# Patient Record
Sex: Female | Born: 1970 | Race: White | Hispanic: No | Marital: Married | Smoking: Current every day smoker
Health system: Southern US, Community
[De-identification: ages and names within clinical notes are randomized; demographics above are authoritative.]

## PROBLEM LIST (undated history)

## (undated) DIAGNOSIS — IMO0002 Reserved for concepts with insufficient information to code with codable children: Secondary | ICD-10-CM

## (undated) DIAGNOSIS — R002 Palpitations: Secondary | ICD-10-CM

## (undated) DIAGNOSIS — N943 Premenstrual tension syndrome: Secondary | ICD-10-CM

## (undated) DIAGNOSIS — I454 Nonspecific intraventricular block: Secondary | ICD-10-CM

## (undated) HISTORY — PX: OTHER SURGICAL HISTORY: SHX169

## (undated) HISTORY — DX: Premenstrual tension syndrome: N94.3

## (undated) HISTORY — DX: Reserved for concepts with insufficient information to code with codable children: IMO0002

## (undated) HISTORY — DX: Nonspecific intraventricular block: I45.4

## (undated) HISTORY — DX: Palpitations: R00.2

---

## 1998-01-22 ENCOUNTER — Encounter: Admission: RE | Admit: 1998-01-22 | Discharge: 1998-01-22 | Payer: Self-pay | Admitting: Family Medicine

## 1998-05-14 ENCOUNTER — Encounter: Admission: RE | Admit: 1998-05-14 | Discharge: 1998-05-14 | Payer: Self-pay | Admitting: Family Medicine

## 1998-05-27 ENCOUNTER — Encounter: Admission: RE | Admit: 1998-05-27 | Discharge: 1998-05-27 | Payer: Self-pay | Admitting: Sports Medicine

## 1998-05-28 ENCOUNTER — Encounter: Admission: RE | Admit: 1998-05-28 | Discharge: 1998-05-28 | Payer: Self-pay | Admitting: Family Medicine

## 1998-07-29 ENCOUNTER — Encounter: Admission: RE | Admit: 1998-07-29 | Discharge: 1998-07-29 | Payer: Self-pay | Admitting: Sports Medicine

## 1998-08-22 ENCOUNTER — Ambulatory Visit (HOSPITAL_COMMUNITY): Admission: RE | Admit: 1998-08-22 | Discharge: 1998-08-22 | Payer: Self-pay | Admitting: Obstetrics

## 1998-08-29 ENCOUNTER — Encounter: Admission: RE | Admit: 1998-08-29 | Discharge: 1998-08-29 | Payer: Self-pay | Admitting: Family Medicine

## 1998-09-24 ENCOUNTER — Encounter: Admission: RE | Admit: 1998-09-24 | Discharge: 1998-09-24 | Payer: Self-pay | Admitting: Family Medicine

## 1998-09-29 ENCOUNTER — Encounter: Admission: RE | Admit: 1998-09-29 | Discharge: 1998-09-29 | Payer: Self-pay | Admitting: Family Medicine

## 1998-10-22 ENCOUNTER — Encounter: Admission: RE | Admit: 1998-10-22 | Discharge: 1998-10-22 | Payer: Self-pay | Admitting: Family Medicine

## 1998-11-06 ENCOUNTER — Encounter: Admission: RE | Admit: 1998-11-06 | Discharge: 1998-11-06 | Payer: Self-pay | Admitting: Family Medicine

## 1998-12-04 ENCOUNTER — Encounter: Admission: RE | Admit: 1998-12-04 | Discharge: 1998-12-04 | Payer: Self-pay | Admitting: Family Medicine

## 1998-12-25 ENCOUNTER — Encounter: Admission: RE | Admit: 1998-12-25 | Discharge: 1998-12-25 | Payer: Self-pay | Admitting: Family Medicine

## 1999-01-01 ENCOUNTER — Encounter: Admission: RE | Admit: 1999-01-01 | Discharge: 1999-01-01 | Payer: Self-pay | Admitting: Family Medicine

## 1999-01-05 ENCOUNTER — Inpatient Hospital Stay (HOSPITAL_COMMUNITY): Admission: AD | Admit: 1999-01-05 | Discharge: 1999-01-08 | Payer: Self-pay | Admitting: *Deleted

## 1999-01-08 ENCOUNTER — Encounter: Admission: RE | Admit: 1999-01-08 | Discharge: 1999-01-08 | Payer: Self-pay | Admitting: Family Medicine

## 1999-01-15 ENCOUNTER — Encounter: Admission: RE | Admit: 1999-01-15 | Discharge: 1999-01-15 | Payer: Self-pay | Admitting: Family Medicine

## 1999-03-02 ENCOUNTER — Encounter: Admission: RE | Admit: 1999-03-02 | Discharge: 1999-03-02 | Payer: Self-pay | Admitting: Family Medicine

## 1999-04-15 ENCOUNTER — Encounter: Admission: RE | Admit: 1999-04-15 | Discharge: 1999-04-15 | Payer: Self-pay | Admitting: Family Medicine

## 2006-10-28 ENCOUNTER — Ambulatory Visit: Payer: Self-pay | Admitting: Family Medicine

## 2006-10-28 DIAGNOSIS — K1321 Leukoplakia of oral mucosa, including tongue: Secondary | ICD-10-CM

## 2006-10-28 DIAGNOSIS — J309 Allergic rhinitis, unspecified: Secondary | ICD-10-CM | POA: Insufficient documentation

## 2006-11-28 ENCOUNTER — Ambulatory Visit: Payer: Self-pay | Admitting: Family Medicine

## 2006-12-14 ENCOUNTER — Encounter: Payer: Self-pay | Admitting: Family Medicine

## 2006-12-29 ENCOUNTER — Encounter: Payer: Self-pay | Admitting: Family Medicine

## 2007-03-01 ENCOUNTER — Ambulatory Visit: Payer: Self-pay | Admitting: Family Medicine

## 2007-03-01 LAB — CONVERTED CEMR LAB
ALT: 11 units/L (ref 0–35)
AST: 16 units/L (ref 0–37)
Albumin: 4.7 g/dL (ref 3.5–5.2)
Alkaline Phosphatase: 49 units/L (ref 39–117)
BUN: 13 mg/dL (ref 6–23)
CO2: 23 meq/L (ref 19–32)
Calcium: 9.5 mg/dL (ref 8.4–10.5)
Chloride: 107 meq/L (ref 96–112)
Cholesterol: 194 mg/dL (ref 0–200)
Creatinine, Ser: 0.94 mg/dL (ref 0.40–1.20)
Glucose, Bld: 91 mg/dL (ref 70–99)
HDL: 54 mg/dL (ref >39)
LDL Cholesterol: 117 mg/dL — ABNORMAL HIGH (ref 0–99)
Potassium: 4.6 meq/L (ref 3.5–5.3)
Sodium: 140 meq/L (ref 135–145)
TSH: 1.578 microintl units/mL (ref 0.350–5.50)
Total Bilirubin: 0.6 mg/dL (ref 0.3–1.2)
Total CHOL/HDL Ratio: 3.6
Total Protein: 7.2 g/dL (ref 6.0–8.3)
Triglycerides: 114 mg/dL (ref <150)
VLDL: 23 mg/dL (ref 0–40)

## 2007-03-02 ENCOUNTER — Encounter: Payer: Self-pay | Admitting: Family Medicine

## 2007-03-15 ENCOUNTER — Encounter: Payer: Self-pay | Admitting: Family Medicine

## 2007-03-27 ENCOUNTER — Ambulatory Visit: Payer: Self-pay | Admitting: Family Medicine

## 2007-03-27 LAB — CONVERTED CEMR LAB
Bilirubin Urine: NEGATIVE
Glucose, Urine, Semiquant: NEGATIVE
Ketones, urine, test strip: NEGATIVE
Nitrite: NEGATIVE
Protein, U semiquant: NEGATIVE
Specific Gravity, Urine: <1.005
Urobilinogen, UA: NEGATIVE
pH: 6

## 2007-03-29 ENCOUNTER — Encounter: Payer: Self-pay | Admitting: Family Medicine

## 2007-03-29 ENCOUNTER — Telehealth (INDEPENDENT_AMBULATORY_CARE_PROVIDER_SITE_OTHER): Payer: Self-pay | Admitting: *Deleted

## 2007-03-29 LAB — CONVERTED CEMR LAB
Clue Cells Wet Prep HPF POC: NONE SEEN
Trich, Wet Prep: NONE SEEN
WBC, Wet Prep HPF POC: NONE SEEN
Yeast Wet Prep HPF POC: NONE SEEN

## 2007-03-31 ENCOUNTER — Telehealth (INDEPENDENT_AMBULATORY_CARE_PROVIDER_SITE_OTHER): Payer: Self-pay | Admitting: *Deleted

## 2007-05-10 ENCOUNTER — Other Ambulatory Visit: Admission: RE | Admit: 2007-05-10 | Discharge: 2007-05-10 | Payer: Self-pay | Admitting: Obstetrics and Gynecology

## 2007-11-07 ENCOUNTER — Ambulatory Visit: Payer: Self-pay | Admitting: Family Medicine

## 2008-04-08 ENCOUNTER — Ambulatory Visit: Payer: Self-pay | Admitting: Family Medicine

## 2008-04-08 DIAGNOSIS — N943 Premenstrual tension syndrome: Secondary | ICD-10-CM | POA: Insufficient documentation

## 2008-04-15 ENCOUNTER — Encounter: Payer: Self-pay | Admitting: Family Medicine

## 2008-04-15 LAB — CONVERTED CEMR LAB
ALT: 23 units/L (ref 0–35)
AST: 23 units/L (ref 0–37)
Albumin: 4.7 g/dL (ref 3.5–5.2)
Alkaline Phosphatase: 66 units/L (ref 39–117)
BUN: 13 mg/dL (ref 6–23)
CO2: 23 meq/L (ref 19–32)
Calcium: 9.6 mg/dL (ref 8.4–10.5)
Chloride: 105 meq/L (ref 96–112)
Cholesterol: 189 mg/dL (ref 0–200)
Creatinine, Ser: 0.76 mg/dL (ref 0.40–1.20)
Glucose, Bld: 91 mg/dL (ref 70–99)
HDL: 47 mg/dL (ref >39)
LDL Cholesterol: 116 mg/dL — ABNORMAL HIGH (ref 0–99)
Potassium: 4.9 meq/L (ref 3.5–5.3)
Sodium: 141 meq/L (ref 135–145)
Total Bilirubin: 0.4 mg/dL (ref 0.3–1.2)
Total CHOL/HDL Ratio: 4
Total Protein: 6.9 g/dL (ref 6.0–8.3)
Triglycerides: 131 mg/dL (ref <150)
VLDL: 26 mg/dL (ref 0–40)

## 2008-04-16 ENCOUNTER — Encounter: Payer: Self-pay | Admitting: Family Medicine

## 2008-06-18 ENCOUNTER — Ambulatory Visit: Payer: Self-pay | Admitting: Family Medicine

## 2008-06-19 ENCOUNTER — Encounter: Payer: Self-pay | Admitting: Family Medicine

## 2008-06-25 LAB — CONVERTED CEMR LAB
Ferritin: 40 ng/mL (ref 10–291)
HCT: 40.1 % (ref 36.0–46.0)
Hemoglobin: 13.2 g/dL (ref 12.0–15.0)
MCHC: 32.9 g/dL (ref 30.0–36.0)
MCV: 92.4 fL (ref 78.0–100.0)
Platelets: 245 10*3/uL (ref 150–400)
RBC: 4.34 M/uL (ref 3.87–5.11)
RDW: 12.7 % (ref 11.5–15.5)
TSH: 1.389 microintl units/mL (ref 0.350–4.50)
WBC: 6.7 10*3/uL (ref 4.0–10.5)

## 2008-07-02 ENCOUNTER — Other Ambulatory Visit: Admission: RE | Admit: 2008-07-02 | Discharge: 2008-07-02 | Payer: Self-pay | Admitting: Obstetrics and Gynecology

## 2008-07-17 ENCOUNTER — Ambulatory Visit: Payer: Self-pay | Admitting: Family Medicine

## 2008-09-02 ENCOUNTER — Encounter: Payer: Self-pay | Admitting: Family Medicine

## 2008-09-03 ENCOUNTER — Ambulatory Visit: Payer: Self-pay | Admitting: Family Medicine

## 2009-01-09 ENCOUNTER — Encounter: Payer: Self-pay | Admitting: Cardiology

## 2009-01-09 ENCOUNTER — Ambulatory Visit: Payer: Self-pay | Admitting: Family Medicine

## 2009-01-09 DIAGNOSIS — I4460 Unspecified fascicular block: Secondary | ICD-10-CM

## 2009-01-09 DIAGNOSIS — R002 Palpitations: Secondary | ICD-10-CM | POA: Insufficient documentation

## 2009-01-15 ENCOUNTER — Encounter: Payer: Self-pay | Admitting: Cardiology

## 2009-01-15 ENCOUNTER — Ambulatory Visit: Payer: Self-pay | Admitting: Cardiology

## 2009-01-15 DIAGNOSIS — R9431 Abnormal electrocardiogram [ECG] [EKG]: Secondary | ICD-10-CM

## 2009-01-24 ENCOUNTER — Ambulatory Visit: Payer: Self-pay

## 2009-01-24 ENCOUNTER — Encounter: Payer: Self-pay | Admitting: Cardiology

## 2009-05-20 ENCOUNTER — Ambulatory Visit: Payer: Self-pay | Admitting: Family Medicine

## 2009-05-20 DIAGNOSIS — R197 Diarrhea, unspecified: Secondary | ICD-10-CM

## 2009-05-21 ENCOUNTER — Encounter: Payer: Self-pay | Admitting: Family Medicine

## 2009-05-21 LAB — CONVERTED CEMR LAB
ALT: 11 units/L (ref 0–35)
AST: 17 units/L (ref 0–37)
Albumin: 4.8 g/dL (ref 3.5–5.2)
Alkaline Phosphatase: 54 units/L (ref 39–117)
BUN: 14 mg/dL (ref 6–23)
Basophils Absolute: 0 10*3/uL (ref 0.0–0.1)
Basophils Relative: 0 % (ref 0–1)
CO2: 25 meq/L (ref 19–32)
Calcium: 9.5 mg/dL (ref 8.4–10.5)
Chloride: 105 meq/L (ref 96–112)
Creatinine, Ser: 0.79 mg/dL (ref 0.40–1.20)
Eosinophils Absolute: 0.3 10*3/uL (ref 0.0–0.7)
Eosinophils Relative: 5 % (ref 0–5)
Glucose, Bld: 86 mg/dL (ref 70–99)
HCT: 44.1 % (ref 36.0–46.0)
Hemoglobin: 14.2 g/dL (ref 12.0–15.0)
Lymphocytes Relative: 35 % (ref 12–46)
Lymphs Abs: 2 10*3/uL (ref 0.7–4.0)
MCHC: 32.2 g/dL (ref 30.0–36.0)
MCV: 96.7 fL (ref 78.0–100.0)
Monocytes Absolute: 0.5 10*3/uL (ref 0.1–1.0)
Monocytes Relative: 8 % (ref 3–12)
Neutro Abs: 2.9 10*3/uL (ref 1.7–7.7)
Neutrophils Relative %: 52 % (ref 43–77)
Platelets: 275 10*3/uL (ref 150–400)
Potassium: 4.6 meq/L (ref 3.5–5.3)
RBC: 4.56 M/uL (ref 3.87–5.11)
RDW: 13.2 % (ref 11.5–15.5)
Sodium: 142 meq/L (ref 135–145)
TSH: 1.543 microintl units/mL (ref 0.350–4.500)
Total Bilirubin: 0.4 mg/dL (ref 0.3–1.2)
Total Protein: 7.5 g/dL (ref 6.0–8.3)
WBC: 5.6 10*3/uL (ref 4.0–10.5)

## 2009-10-17 ENCOUNTER — Ambulatory Visit: Payer: Self-pay | Admitting: Family Medicine

## 2009-10-17 LAB — CONVERTED CEMR LAB
Bilirubin Urine: NEGATIVE
Glucose, Urine, Semiquant: NEGATIVE
Nitrite: NEGATIVE
Protein, U semiquant: NEGATIVE
Specific Gravity, Urine: 1.025
Urobilinogen, UA: 0.2
WBC Urine, dipstick: NEGATIVE
pH: 6

## 2009-10-20 ENCOUNTER — Ambulatory Visit: Payer: Self-pay | Admitting: Family Medicine

## 2009-11-03 ENCOUNTER — Encounter: Payer: Self-pay | Admitting: Family Medicine

## 2009-11-05 ENCOUNTER — Encounter: Payer: Self-pay | Admitting: Family Medicine

## 2009-12-30 ENCOUNTER — Ambulatory Visit: Payer: Self-pay | Admitting: Family Medicine

## 2010-04-22 ENCOUNTER — Ambulatory Visit: Payer: Self-pay | Admitting: Family Medicine

## 2010-04-22 DIAGNOSIS — D509 Iron deficiency anemia, unspecified: Secondary | ICD-10-CM

## 2010-04-23 ENCOUNTER — Encounter: Payer: Self-pay | Admitting: Family Medicine

## 2010-04-23 LAB — CONVERTED CEMR LAB
Ferritin: 26 ng/mL (ref 10–291)
HCT: 41 % (ref 36.0–46.0)
Hemoglobin: 13.4 g/dL (ref 12.0–15.0)
Iron: 113 ug/dL (ref 42–145)
MCHC: 32.7 g/dL (ref 30.0–36.0)
MCV: 95.3 fL (ref 78.0–100.0)
Platelets: 267 10*3/uL (ref 150–400)
RBC: 4.3 M/uL (ref 3.87–5.11)
RDW: 12.7 % (ref 11.5–15.5)
Saturation Ratios: 29 % (ref 20–55)
TIBC: 386 ug/dL (ref 250–470)
UIBC: 273 ug/dL
WBC: 5.2 10*3/uL (ref 4.0–10.5)

## 2010-05-01 ENCOUNTER — Ambulatory Visit: Payer: Self-pay | Admitting: Family Medicine

## 2010-07-21 NOTE — Assessment & Plan Note (Signed)
Summary: HEP INJ//VGJ  Nurse Visit   Vitals Entered By: Payton Spark CMA (December 30, 2009 9:15 AM)  Allergies: No Known Drug Allergies  Immunizations Administered:  TwinRix # 2:    Vaccine Type: TwinRix    Site: left deltoid    Dose: 0.5 ml    Route: IM    Given by: Payton Spark CMA    VIS given: 03/09/07 version given December 30, 2009.  Orders Added: 1)  TwinRix 1ml ( Hep A&B Adult dose) [90636] 2)  Admin 1st Vaccine [90471]   Orders Added: 1)  TwinRix 1ml ( Hep A&B Adult dose) [90636] 2)  Admin 1st Vaccine [09811]

## 2010-07-21 NOTE — Assessment & Plan Note (Signed)
Summary: 3 HEP INJ  Nurse Visit   Vitals Entered By: Payton Spark CMA (May 01, 2010 9:20 AM)  Allergies: No Known Drug Allergies  Immunizations Administered:  TwinRix # 3:    Vaccine Type: TwinRix    Site: right deltoid    Dose: 1.0 ml    Route: IM    Given by: Payton Spark CMA    VIS given: 03/09/07 version given May 01, 2010.  Orders Added: 1)  TwinRix 1ml ( Hep A&B Adult dose) [90636] 2)  Admin 1st Vaccine [90471]

## 2010-07-21 NOTE — Assessment & Plan Note (Signed)
Summary: allergies   Vital Signs:  Patient profile:   40 year old female Height:      62 inches Weight:      185 pounds BMI:     33.96 O2 Sat:      100 % on Room air Temp:     97.8 degrees F oral Pulse rate:   69 / minute BP sitting:   135 / 72  (left arm) Cuff size:   large  Vitals Entered By: Payton Spark CMA (April 22, 2010 9:31 AM)  O2 Flow:  Room air CC: Hears thumping in L ear x 2 days (on & off)    Primary Care Provider:  Seymour Bars D.O.  CC:  Hears thumping in L ear x 2 days (on & off) .  History of Present Illness: 40 yo WF presents for L ear pressure for 2 days with an intermittent 'thumping' in her L ear.  Denies any sharp pain.  Denies any ringing in  the ear.  Has hearing problems when she hears the thumping.  She has some itching in the ear and is bothered by postnasal drip.  She is not using anything for allergies now.  No dizziness.    Had the same 'thumping' in the past when she was iron deficient.    Current Medications (verified): 1)  Tri-Sprintec 0.18/0.215/0.25 Mg-35 Mcg Tabs (Norgestim-Eth Estrad Triphasic) .Marland Kitchen.. 1 Tab By Mouth Daily 2)  Ibuprofen 200 Mg Tabs (Ibuprofen) .... As Needed  Allergies (verified): No Known Drug Allergies  Past History:  Past Medical History: Reviewed history from 10/17/2009 and no changes required. 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 Looking for work. In school at [employer] Married- 2 children Quit smoking 5-08 after 10 pack years. Occasional ETOH 3 cups caffieine daily. Does not exercise.  Review of Systems      See HPI  Physical Exam  General:  alert, well-developed, well-nourished, and well-hydrated.   Head:  normocephalic and  atraumatic.   Eyes:  conjunctiva clear Ears:  EACs patent; TMs translucent and gray with good cone of light and bony landmarks.  Nose:  slightly boggy L>R nasal turbinates, no rhinorrhea Mouth:  o/p pink and moist with injection.  no vesicles or exudates.  no tonsilar hypertrophy Neck:  no masses.   Lungs:  Normal respiratory effort, chest expands symmetrically. Lungs are clear to auscultation, no crackles or wheezes. Heart:  Normal rate and regular rhythm. S1 and S2 normal without gallop, murmur, click, rub or other extra sounds. Skin:  color normal and no rashes.   Cervical Nodes:  No lymphadenopathy noted   Impression & Recommendations:  Problem # 1:  ALLERGIC RHINITIS (ICD-477.9) Untreated seasonal allergy flare up. Start plain Zyrtec 10 mg each evening.  Problem # 2:  UNSPECIFIED IRON DEFICIENCY ANEMIA (ICD-280.9) Possibly the cause for the 'thumping' in her ear again -- likely to be her pulse she is hearing.  Check labs today and treat if indicated. Orders: T-CBC No Diff ([id number]) T-Ferritin ([phone]) T-Iron ([phone]) T-Iron Binding Capacity (TIBC) ([id number])  Complete Medication List: 1)  Tri-sprintec 0.18/0.215/0.25 Mg-35 Mcg Tabs (Norgestim-eth estrad triphasic) .Marland Kitchen.. 1 tab by mouth daily 2)  Ibuprofen 200 Mg Tabs (Ibuprofen) .... As needed  Patient Instructions: 1)  Update labs today. 2)  Will call you w/  results tomorrow. 3)  Start on Zyrtec 10 mg every evening thru fall for seasonal allergies. 4)  Call me if any problems.   Orders Added: 1)  T-CBC No Diff [[id number]] 2)  T-Ferritin [[id number]] 3)  T-Iron [[id number]] 4)  T-Iron Binding Capacity (TIBC) [[id number]] 5)  Est. Patient Level III [56387]

## 2010-07-21 NOTE — Letter (Signed)
[facility]   [facility] By: Lanelle Bal 11/20/2009 11:19:06  _____________________________________________________________________  External Attachment:    Type:   Image     Comment:   External Document

## 2010-07-21 NOTE — Assessment & Plan Note (Signed)
Summary: TET SHOT  Nurse Visit   Vitals Entered By: Payton Spark CMA (Oct 20, 2009 8:40 AM)  Allergies: No Known Drug Allergies  Immunizations Administered:  Tetanus Vaccine:    Vaccine Type: Tdap    Site: left deltoid    Dose: 0.5 ml    Route: IM    Given by: Payton Spark CMA    VIS given: 05/09/07 version given Oct 20, 2009.  PPD Results    Date of reading: 10/20/2009    Results: < 5mm    Interpretation: negative  Orders Added: 1)  Tdap => 44yrs IM [90715] 2)  Admin 1st Vaccine [03474]

## 2010-07-21 NOTE — Letter (Signed)
[facility]   Imported By: Lanelle Bal 11/13/2009 13:21:34  _____________________________________________________________________  External Attachment:    Type:   Image     Comment:   External Document

## 2010-07-21 NOTE — Assessment & Plan Note (Signed)
Summary: school phys   Vital Signs:  Patient profile:   40 year old female Height:      62 inches Weight:      190 pounds BMI:     34.88 O2 Sat:      100 % on Room air Temp:     98.4 degrees F oral Pulse rate:   81 / minute BP sitting:   114 / 77  (left arm) Cuff size:   large  Vitals Entered By: Payton Spark CMA (October 17, 2009 10:54 AM)  O2 Flow:  Room air CC: School PE  Vision Screening:Left eye w/o correction: 20 / 20 Right Eye w/o correction: 20 / 15 Both eyes w/o correction:  20/ 15  Color vision testing: normal      Vision Entered By: Payton Spark CMA (October 17, 2009 10:55 AM)   Primary Care Provider:  Seymour Bars D.O.  CC:  School PE.  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  She is doing well.  Seeing gyn for pap smears.  She is due for several immunizations today.    Current Medications (verified): 1)  Tri-Sprintec 0.18/0.215/0.25 Mg-35 Mcg Tabs (Norgestim-Eth Estrad Triphasic) .Marland Kitchen.. 1 Tab By Mouth Daily 2)  Ibuprofen 200 Mg Tabs (Ibuprofen) .... 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 Surgery on skull as infant  Family History: Reviewed history from 01/15/2009 and no changes required. Father: DM, HTN, alive Mother healthy; heart murmur brother asthma PAunt, Pun[place], PGP's: DM PGF: TIAs, AMI  Social History: Reviewed history from 01/15/2009 and no changes required. Looking for work. In school at [employer] Married- 2 children Quit smoking 5-08 after 10 pack years. Occasional ETOH 3 cups caffieine  daily. Does not exercise.  Review of Systems  The patient denies anorexia, fever, weight loss, weight gain, vision loss, decreased hearing, hoarseness, chest pain, syncope, dyspnea on exertion, peripheral edema, prolonged cough, headaches, hemoptysis, abdominal pain, melena, hematochezia, severe indigestion/heartburn, hematuria, incontinence, genital sores, muscle weakness, suspicious skin lesions, transient blindness, difficulty walking, depression, unusual weight change, abnormal bleeding, enlarged lymph nodes, angioedema, breast masses, and testicular masses.    Physical Exam  General:  alert, well-developed, well-nourished, and well-hydrated.  obese Head:  normocephalic and atraumatic.   Eyes:  pupils equal, pupils round, and pupils reactive to light.   Ears:  no external deformities.   Nose:  no nasal discharge.   Mouth:  good dentition and pharynx pink and moist.   Neck:  no masses.   Lungs:  Normal respiratory effort, chest expands symmetrically. Lungs are clear to auscultation, no crackles or wheezes. Heart:  Normal rate and regular rhythm. S1 and S2 normal without gallop, murmur, click, rub or other extra sounds. Abdomen:  Bowel sounds positive,abdomen soft and non-tender without masses, organomegaly or hernias noted. Msk:  No deformity or scoliosis noted of thoracic or lumbar spine.   Pulses:  2+ radial and pedal pulses Extremities:  no E/C/C Neurologic:  gait normal and DTRs symmetrical and normal.   Skin:  color normal.   Cervical Nodes:  No lymphadenopathy noted Psych:  good eye contact, not anxious appearing, and not depressed appearing.  Impression & Recommendations:  Problem # 1:  OTH GENERAL MEDICAL EXAMINATION ADMIN PURPOSES (ICD-V70.3) Immunizations updated.  PPD placed.  Form filled out, pending the PPD results on Monday.  UA is normal.  Vision/ BP at goal. Labs updated except for FLP in the past year.  Gyn has completed her pap smear. Discussed healthy diet,  regular exercise, MVI.   Orders: T-Varicella-Zoster Antibody ([id number]) UA Dipstick w/o Micro (automated)  (81003)  Complete Medication List: 1)  Tri-sprintec 0.18/0.215/0.25 Mg-35 Mcg Tabs (Norgestim-eth estrad triphasic) .Marland Kitchen.. 1 tab by mouth daily 2)  Ibuprofen 200 Mg Tabs (Ibuprofen) .... As needed  Other Orders: TB Skin Test [phone]) Admin 1st Vaccine (46962) Admin 1st Vaccine [facility]) [phone]) TwinRix 1ml ( Hep A&B Adult dose) (32440) Admin of Any Addtl Vaccine (10272) Admin of Any Addtl Vaccine (State) (53664Q) Injectable Polio (IPV) (03474) Admin of Any Addtl Vaccine (25956) Admin of Any Addtl Vaccine (State) (38756E)  Patient Instructions: 1)  Polio  2)  TwinRix 3)  PPD today; read on Monday morning. 4)  Varicella titer today. 5)  Form completed.    MMR Immunization History:    MMR # 1:  Historical (02/19/2001)  Tetanus/Td Immunization History:    Tetanus/Td # 1:  Historical (02/19/2001)  Polio Vaccine # 1    Vaccine Type: IPV    Site: left deltoid    Dose: 0.5 ml    Route: IM    Given by: Payton Spark CMA    VIS given: 06/21/98 version given October 17, 2009.  PPD Application    Vaccine Type: PPD    Site: left forearm    Dose: 0.1 ml    Route: ID    Given by: Payton Spark CMA  TwinRix # 1    Vaccine Type: TwinRix    Site: right deltoid    Dose: 0.1 ml    Route: IM    Given by: Payton Spark CMA    Exp. Date: 05/22/2010    Lot #: [id number]    VIS given: 03/09/07 version given October 17, 2009.   Laboratory Results   Urine Tests    Routine Urinalysis   Color: yellow Appearance: Clear Glucose: negative   (Normal Range: Negative) Bilirubin: negative   (Normal Range: Negative) Ketone: trace (5)   (Normal Range: Negative) Spec. Gravity: 1.025   (Normal Range: 1.003-1.035) Blood: trace-intact   (Normal Range: Negative) pH: 6.0   (Normal Range: 5.0-8.0) Protein: negative   (Normal Range: Negative) Urobilinogen: 0.2   (Normal Range:  0-1) Nitrite: negative   (Normal Range: Negative) Leukocyte Esterace: negative   (Normal Range: Negative)

## 2011-02-02 ENCOUNTER — Encounter: Payer: Self-pay | Admitting: Cardiology

## 2011-05-28 ENCOUNTER — Ambulatory Visit: Payer: Self-pay | Admitting: Family Medicine

## 2011-07-30 ENCOUNTER — Ambulatory Visit (INDEPENDENT_AMBULATORY_CARE_PROVIDER_SITE_OTHER): Payer: BC Managed Care – PPO | Admitting: Family Medicine

## 2011-07-30 ENCOUNTER — Encounter: Payer: Self-pay | Admitting: Family Medicine

## 2011-07-30 ENCOUNTER — Other Ambulatory Visit (HOSPITAL_COMMUNITY)
Admission: RE | Admit: 2011-07-30 | Discharge: 2011-07-30 | Disposition: A | Discharge status: Home / Self Care | Payer: BC Managed Care – PPO | Source: Ambulatory Visit | Attending: Family Medicine | Admitting: Family Medicine

## 2011-07-30 VITALS — BP 140/79 | HR 70 | Ht 65.0 in | Wt 194.0 lb

## 2011-07-30 DIAGNOSIS — G43109 Migraine with aura, not intractable, without status migrainosus: Secondary | ICD-10-CM | POA: Insufficient documentation

## 2011-07-30 DIAGNOSIS — Z01419 Encounter for gynecological examination (general) (routine) without abnormal findings: Secondary | ICD-10-CM

## 2011-07-30 DIAGNOSIS — Z1159 Encounter for screening for other viral diseases: Secondary | ICD-10-CM | POA: Insufficient documentation

## 2011-07-30 DIAGNOSIS — B977 Papillomavirus as the cause of diseases classified elsewhere: Secondary | ICD-10-CM | POA: Insufficient documentation

## 2011-07-30 MED ORDER — NORGESTIM-ETH ESTRAD TRIPHASIC 0.18/0.215/0.25 MG-35 MCG PO TABS: 1.0000 | ORAL_TABLET | Freq: Every day | ORAL | Status: DC

## 2011-07-30 NOTE — Patient Instructions (Addendum)
Needs cardio exercise for 30 minutes 5 days per week.  We will call you with your lab results. If you don't here from [place] in about a week then please give [place] a call at [phone].

## 2011-07-30 NOTE — Progress Notes (Signed)
  Subjective:     Tiffany Pearson is a 41 y.o. female and is here for a comprehensive physical exam. The patient reports no problems.  History   Social History  . Marital Status: Married    Spouse Name: N/A    Number of Children: N/A  . Years of Education: N/A   Occupational History  . Not on file.   Social History Main Topics  . Smoking status: Former Smoker    Quit date: 10/20/2006  . Smokeless tobacco: Not on file   Comment: Quit smoking 5-08 after 10 pack years  . Alcohol Use: 0.0 - 0.5 oz/week    0-1 drink(s) per week     occ  . Drug Use: No  . Sexually Active: Yes -- Female partner(s)   Other Topics Concern  . Not on file   Social History Narrative   3 cups caffeine daily. Does not exercise.    Health Maintenance  Topic Date Due  . Influenza Vaccine  03/21/2012  . Pap Smear  07/29/2014  . Tetanus/tdap  10/21/2019    The following portions of the patient's history were reviewed and updated as appropriate: allergies, current medications, past family history, past medical history, past social history, past surgical history and problem list.  Review of Systems A comprehensive review of systems was negative.   Objective:    BP 140/79  Pulse 70  Ht 5\' 5"  (1.651 m)  Wt 194 lb (87.998 kg)  BMI 32.28 kg/m2  LMP 06/22/2011 General appearance: alert, cooperative and appears stated age Head: Normocephalic, without obvious abnormality, atraumatic Eyes: conjunctivae/corneas clear. PERRL, EOM's intact. Fundi benign., conje clear, EOMi, PEERLA Ears: normal TM's and external ear canals both ears Nose: Nares normal. Septum midline. Mucosa normal. No drainage or sinus tenderness. Throat: lips, mucosa, and tongue normal; teeth and gums normal Neck: no adenopathy, no carotid bruit, no JVD, supple, symmetrical, trachea midline and thyroid not enlarged, symmetric, no tenderness/mass/nodules Back: symmetric, no curvature. ROM normal. No CVA tenderness. Lungs: clear to  auscultation bilaterally Breasts: normal appearance, no masses or tenderness Heart: regular rate and rhythm, S1, S2 normal, no murmur, click, rub or gallop Abdomen: soft, non-tender; bowel sounds normal; no masses,  no organomegaly Pelvic: cervix normal in appearance, external genitalia normal, no adnexal masses or tenderness, no cervical motion tenderness, rectovaginal septum normal, uterus normal size, shape, and consistency and vagina normal without discharge Extremities: no edema Pulses: 2+ and symmetric Skin: Skin color, texture, turgor normal. No rashes or lesions Lymph nodes: Cervical, supraclavicular, and axillary nodes normal. Neurologic: Alert and oriented X 3, normal strength and tone. Normal symmetric reflexes. Normal coordination and gait    Assessment:    Healthy female exam.      Plan:     See After Visit Summary for Counseling Recommendations   Start a regular exercise program and make sure you are eating a healthy diet Try to eat 4 servings of dairy a day or take a calcium supplement (500mg  twice a day). Your vaccines are up to date.  We will call with pap results We will call with lab results. CMP and screening lipids.   Dicussed IUD ooption for contraception. For now will refill OCPs.  If wants to do th eIUD let me know nad iwll refer to gyn for insertion.

## 2011-08-06 LAB — LIPID PANEL
LDL Cholesterol: 125 mg/dL — ABNORMAL HIGH (ref 0–99)
VLDL: 27 mg/dL (ref 0–40)

## 2011-08-07 LAB — COMPLETE METABOLIC PANEL WITH GFR
ALT: 10 U/L (ref 0–35)
AST: 17 U/L (ref 0–37)
Albumin: 4.7 g/dL (ref 3.5–5.2)
BUN: 13 mg/dL (ref 6–23)
CO2: 27 mEq/L (ref 19–32)
Calcium: 9.6 mg/dL (ref 8.4–10.5)
Chloride: 103 mEq/L (ref 96–112)
Potassium: 4.2 mEq/L (ref 3.5–5.3)

## 2011-08-27 ENCOUNTER — Ambulatory Visit: Payer: BC Managed Care – PPO | Admitting: Family Medicine

## 2011-08-27 ENCOUNTER — Ambulatory Visit (INDEPENDENT_AMBULATORY_CARE_PROVIDER_SITE_OTHER): Payer: BC Managed Care – PPO | Admitting: Family Medicine

## 2011-08-27 DIAGNOSIS — R030 Elevated blood-pressure reading, without diagnosis of hypertension: Secondary | ICD-10-CM

## 2011-08-27 NOTE — Progress Notes (Signed)
LMOM for pt to return call. 

## 2011-08-27 NOTE — Progress Notes (Signed)
  Subjective:    Patient ID: Tiffany Pearson, female    DOB: 05-Feb-1971, 41 y.o.   MRN: [id number] BP check; no dizziness, sinus HA, pt states that when she takes a deep breath she gets sharp pain in chest, 1-4 x week, pt states she has decided not to do BC pill. HPI    Review of Systems     Objective:   Physical Exam        Assessment & Plan:  Elevated BP- Much better today.  Will hold off on starting med.  Needs appt for the chest pain.

## 2011-09-02 ENCOUNTER — Telehealth: Payer: Self-pay | Admitting: Family Medicine

## 2011-09-02 ENCOUNTER — Encounter: Payer: Self-pay | Admitting: Family Medicine

## 2011-09-02 ENCOUNTER — Ambulatory Visit (INDEPENDENT_AMBULATORY_CARE_PROVIDER_SITE_OTHER): Payer: BC Managed Care – PPO | Admitting: Family Medicine

## 2011-09-02 VITALS — BP 145/81 | HR 115 | Temp 98.3°F | Ht 65.0 in | Wt 196.0 lb

## 2011-09-02 DIAGNOSIS — R0789 Other chest pain: Secondary | ICD-10-CM

## 2011-09-02 NOTE — Telephone Encounter (Signed)
Call Pt: Her EKG is stable.  Recommmend labs and CXR. Will place orders. Can go for both today if has time.

## 2011-09-02 NOTE — Progress Notes (Signed)
Subjective:    Patient ID: Tiffany Pearson, female    DOB: 02-06-1971, 41 y.o.   MRN: [id number]  HPI  CP x 1 mo.  Occur for a few seconds about 2x/week.  Always on left side of chest, though no pain for last week.  No fam hx heart dz.  Hx of chronic insomnia x 2 years. Today woke up with nasal congestion, HA and stiffness, feeling sore. No hx of anemia except after giving birth. Regular periods.  No blood in stool. CP with walking and at rest.  Say has been very stressed for the last 2 years.  DEnies feeing sad or suicidal.   No problems falling aleep but wakes up every hour. Has tried benadyl, but not melatonins.  Benadrul kept her awake.  Only snores if sick.    Review of Systems  BP 145/81  Pulse 115  Temp(Src) 98.3 F (36.8 C) (Oral)  Ht 5\' 5"  (1.651 m)  Wt 196 lb (88.905 kg)  BMI 32.62 kg/m2  SpO2 100%    No Known Allergies  Past Medical History  Diagnosis Date  . Palpitations   . Bundle branch block     righ ehmiblock  . PMS (premenstrual syndrome)   . Leukoplakia     oral mucosa including tongue  . Allergic rhinitis     Past Surgical History  Procedure Date  . Skull surgery     History   Social History  . Marital Status: Married    Spouse Name: N/A    Number of Children: N/A  . Years of Education: N/A   Occupational History  . Not on file.   Social History Main Topics  . Smoking status: Former Smoker    Quit date: 10/20/2006  . Smokeless tobacco: Not on file   Comment: Quit smoking 5-08 after 10 pack years  . Alcohol Use: 0.0 - 0.5 oz/week    0-1 drink(s) per week     occ  . Drug Use: No  . Sexually Active: Yes -- Female partner(s)   Other Topics Concern  . Not on file   Social History Narrative   3 cups caffeine daily. Does not exercise.     Family History  Problem Relation Age of Onset  . Diabetes Father   . Hypertension Father   . Transient ischemic attack Paternal Grandfather     @[email or web address]        Objective:   Physical Exam   Constitutional: She is oriented to person, place, and time. She appears well-developed and well-nourished.  HENT:  Head: Normocephalic and atraumatic.  Right Ear: External ear normal.  Left Ear: External ear normal.  Nose: Nose normal.  Mouth/Throat: Oropharynx is clear and moist.       TMs and canals are clear.   Eyes: Conjunctivae and EOM are normal. Pupils are equal, round, and reactive to light.  Neck: Neck supple. No thyromegaly present.  Cardiovascular: Normal rate, regular rhythm and normal heart sounds.   Pulmonary/Chest: Effort normal and breath sounds normal. She has no wheezes.  Musculoskeletal: She exhibits no edema.  Lymphadenopathy:    She has no cervical adenopathy.  Neurological: She is alert and oriented to person, place, and time.  Skin: Skin is warm and dry.  Psychiatric: She has a normal mood and affect.          Assessment & Plan:  Atypical chest pain - Unclear etiology. Consider Cardiac EKG today shows.NSR 90 bpm, normal axis, intervals. No Q waves or ST-T  wave changes. Consider stress and possibly related to her insomnia. Recommend a trial of melatnonin, Avoid caffeine after lunch time.  Will mail H.O on sleep hygiene. If not improving then consider sleep aid. Will check CBC, TSH, CMP, d-dimer.  Will also get CXR to rule out walking PNA etc.

## 2011-09-03 ENCOUNTER — Encounter: Payer: Self-pay | Admitting: *Deleted

## 2011-09-03 NOTE — Telephone Encounter (Signed)
LM for pt to go get CXR and labs done.

## 2011-09-16 ENCOUNTER — Ambulatory Visit
Admission: RE | Admit: 2011-09-16 | Discharge: 2011-09-16 | Disposition: A | Discharge status: Home / Self Care | Payer: BC Managed Care – PPO | Source: Ambulatory Visit | Attending: Family Medicine | Admitting: Family Medicine

## 2011-09-16 ENCOUNTER — Other Ambulatory Visit: Payer: Self-pay | Admitting: Family Medicine

## 2011-09-16 DIAGNOSIS — R0789 Other chest pain: Secondary | ICD-10-CM

## 2011-09-16 LAB — D-DIMER, QUANTITATIVE: D-Dimer, Quant: 0.26 ug/mL-FEU (ref 0.00–0.48)

## 2011-09-16 MED ORDER — ALBUTEROL SULFATE HFA 108 (90 BASE) MCG/ACT IN AERS: 2.0000 | INHALATION_SPRAY | Freq: Two times a day (BID) | RESPIRATORY_TRACT | Status: DC | PRN

## 2011-09-17 LAB — CBC WITH DIFFERENTIAL/PLATELET
Basophils Relative: 0 % (ref 0–1)
Hemoglobin: 12.6 g/dL (ref 12.0–15.0)
Lymphocytes Relative: 28 % (ref 12–46)
Lymphs Abs: 1.8 10*3/uL (ref 0.7–4.0)
Monocytes Relative: 11 % (ref 3–12)
Neutro Abs: 3.6 10*3/uL (ref 1.7–7.7)
Neutrophils Relative %: 58 % (ref 43–77)
RBC: 4.14 MIL/uL (ref 3.87–5.11)

## 2011-09-17 LAB — COMPLETE METABOLIC PANEL WITH GFR
AST: 16 U/L (ref 0–37)
Alkaline Phosphatase: 58 U/L (ref 39–117)
BUN: 15 mg/dL (ref 6–23)
Calcium: 9.4 mg/dL (ref 8.4–10.5)
Chloride: 103 mEq/L (ref 96–112)
Creat: 0.85 mg/dL (ref 0.50–1.10)

## 2012-04-17 ENCOUNTER — Other Ambulatory Visit: Payer: Self-pay | Admitting: Family Medicine

## 2012-07-14 ENCOUNTER — Encounter: Payer: Self-pay | Admitting: Family Medicine

## 2012-08-16 ENCOUNTER — Ambulatory Visit (INDEPENDENT_AMBULATORY_CARE_PROVIDER_SITE_OTHER): Payer: BC Managed Care – PPO | Admitting: Family Medicine

## 2012-08-16 ENCOUNTER — Encounter: Payer: Self-pay | Admitting: Family Medicine

## 2012-08-16 VITALS — BP 151/94 | HR 93 | Ht 65.0 in | Wt 179.0 lb

## 2012-08-16 DIAGNOSIS — R8290 Unspecified abnormal findings in urine: Secondary | ICD-10-CM

## 2012-08-16 DIAGNOSIS — R82998 Other abnormal findings in urine: Secondary | ICD-10-CM

## 2012-08-16 DIAGNOSIS — R319 Hematuria, unspecified: Secondary | ICD-10-CM

## 2012-08-16 DIAGNOSIS — Z720 Tobacco use: Secondary | ICD-10-CM

## 2012-08-16 LAB — POCT URINALYSIS DIPSTICK
Bilirubin, UA: NEGATIVE
Glucose, UA: NEGATIVE
Ketones, UA: NEGATIVE
Leukocytes, UA: NEGATIVE
Nitrite, UA: NEGATIVE
pH, UA: 7

## 2012-08-16 MED ORDER — CIPROFLOXACIN HCL 500 MG PO TABS: 500.0000 mg | ORAL_TABLET | Freq: Two times a day (BID) | ORAL | Status: AC

## 2012-08-16 MED ORDER — VARENICLINE TARTRATE 0.5 MG PO TABS: 0.5000 mg | ORAL_TABLET | Freq: Two times a day (BID) | ORAL | Status: DC

## 2012-08-16 NOTE — Progress Notes (Signed)
  Subjective:    Patient ID: Tiffany Pearson, female    DOB: [birth year], 42 y.o.   MRN: [id number]  HPI Here for urinary sxs today. Has had malodorous urine for months. Some mild intermittant itching.  Urine has been more cloudy.  No dysuria.  No new sexual partners.  No change in vaginal d/c.  She is not taking any supplement. No fever. No urinary frequency.  Says her tailbone hurts.  No contipation.   Tob abuse - Starting smoking again. Smoking 1/2 pack in a week. Would like a new rx with Chantix. Has used it in the past and worked well w/o any S.E.    Review of Systems     Objective:   Physical Exam  Constitutional: She is oriented to person, place, and time. She appears well-developed and well-nourished.  HENT:  Head: Normocephalic and atraumatic.  Cardiovascular: Normal rate, regular rhythm and normal heart sounds.   Pulmonary/Chest: Effort normal and breath sounds normal.  Abdominal: Soft. Bowel sounds are normal. She exhibits no distension and no mass. There is no tenderness. There is no rebound and no guarding.  Musculoskeletal:  No CVA tenderness  Neurological: She is alert and oriented to person, place, and time.  Skin: Skin is warm and dry.  Psychiatric: She has a normal mood and affect. Her behavior is normal.          Assessment & Plan:  UTI - U/A pos for trace blood.  Discussed optionns of treating now or wait for cultre results. She preferred to treat now. If the culture is negative then would like to have her come back in about 2-3 weeks to recheck her urine to make sure that the hematuria has resolved.  Tob abuse - will rx Chantix. Encouraged her to quit again.

## 2012-08-16 NOTE — Patient Instructions (Addendum)
We will call you with the culture results 

## 2012-08-16 NOTE — Addendum Note (Signed)
Addended by: Deno Etienne on: 08/16/2012 10:10 AM   Modules accepted: Orders

## 2012-08-19 LAB — URINE CULTURE: Colony Count: >=100000

## 2012-08-21 ENCOUNTER — Telehealth: Payer: Self-pay

## 2012-08-21 NOTE — Telephone Encounter (Signed)
I removed mobile and work number. They were no longer her numbers.

## 2012-12-18 IMAGING — CR DG CHEST 2V
2 series · 2 of 2 positions shown · non-contrast
Comparison: None

CLINICAL DATA: Chest pain, shortness of breath for 1 month, former
smoker

CHEST - 2 VIEW

[view not recorded (1 of 2)]
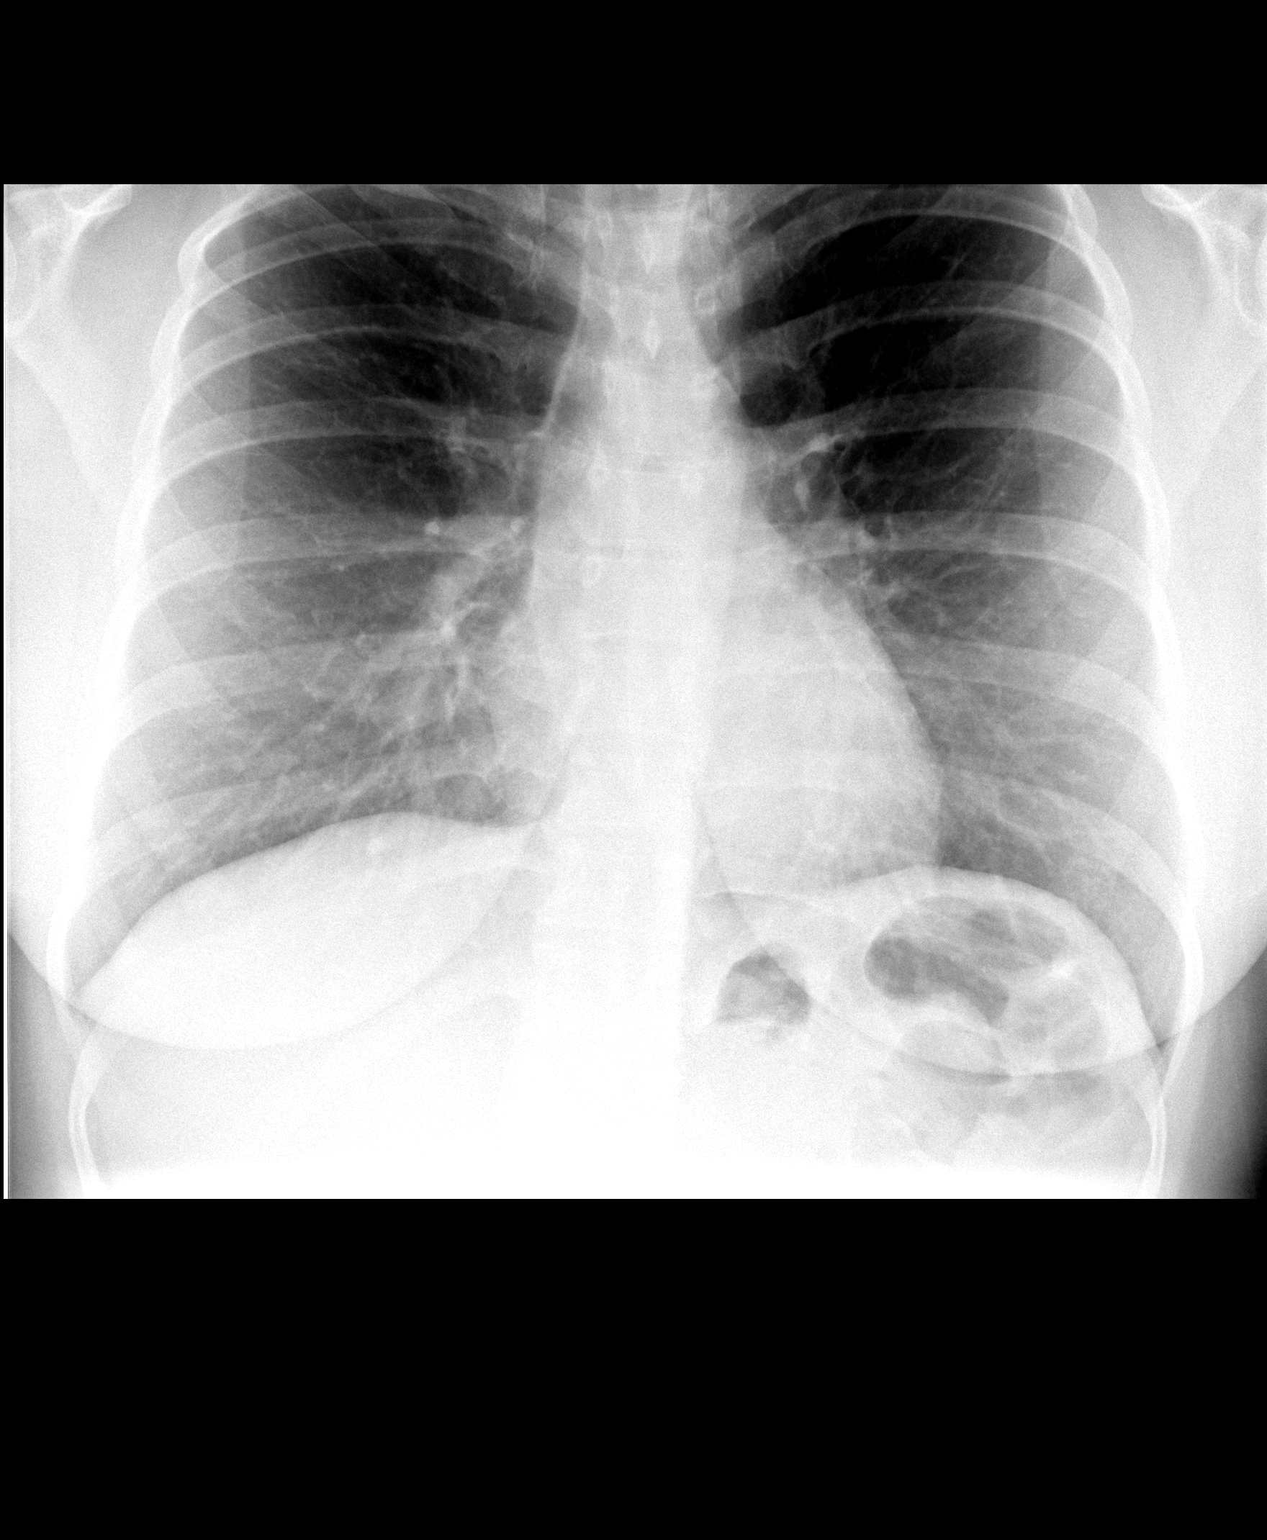

[view not recorded (2 of 2)]
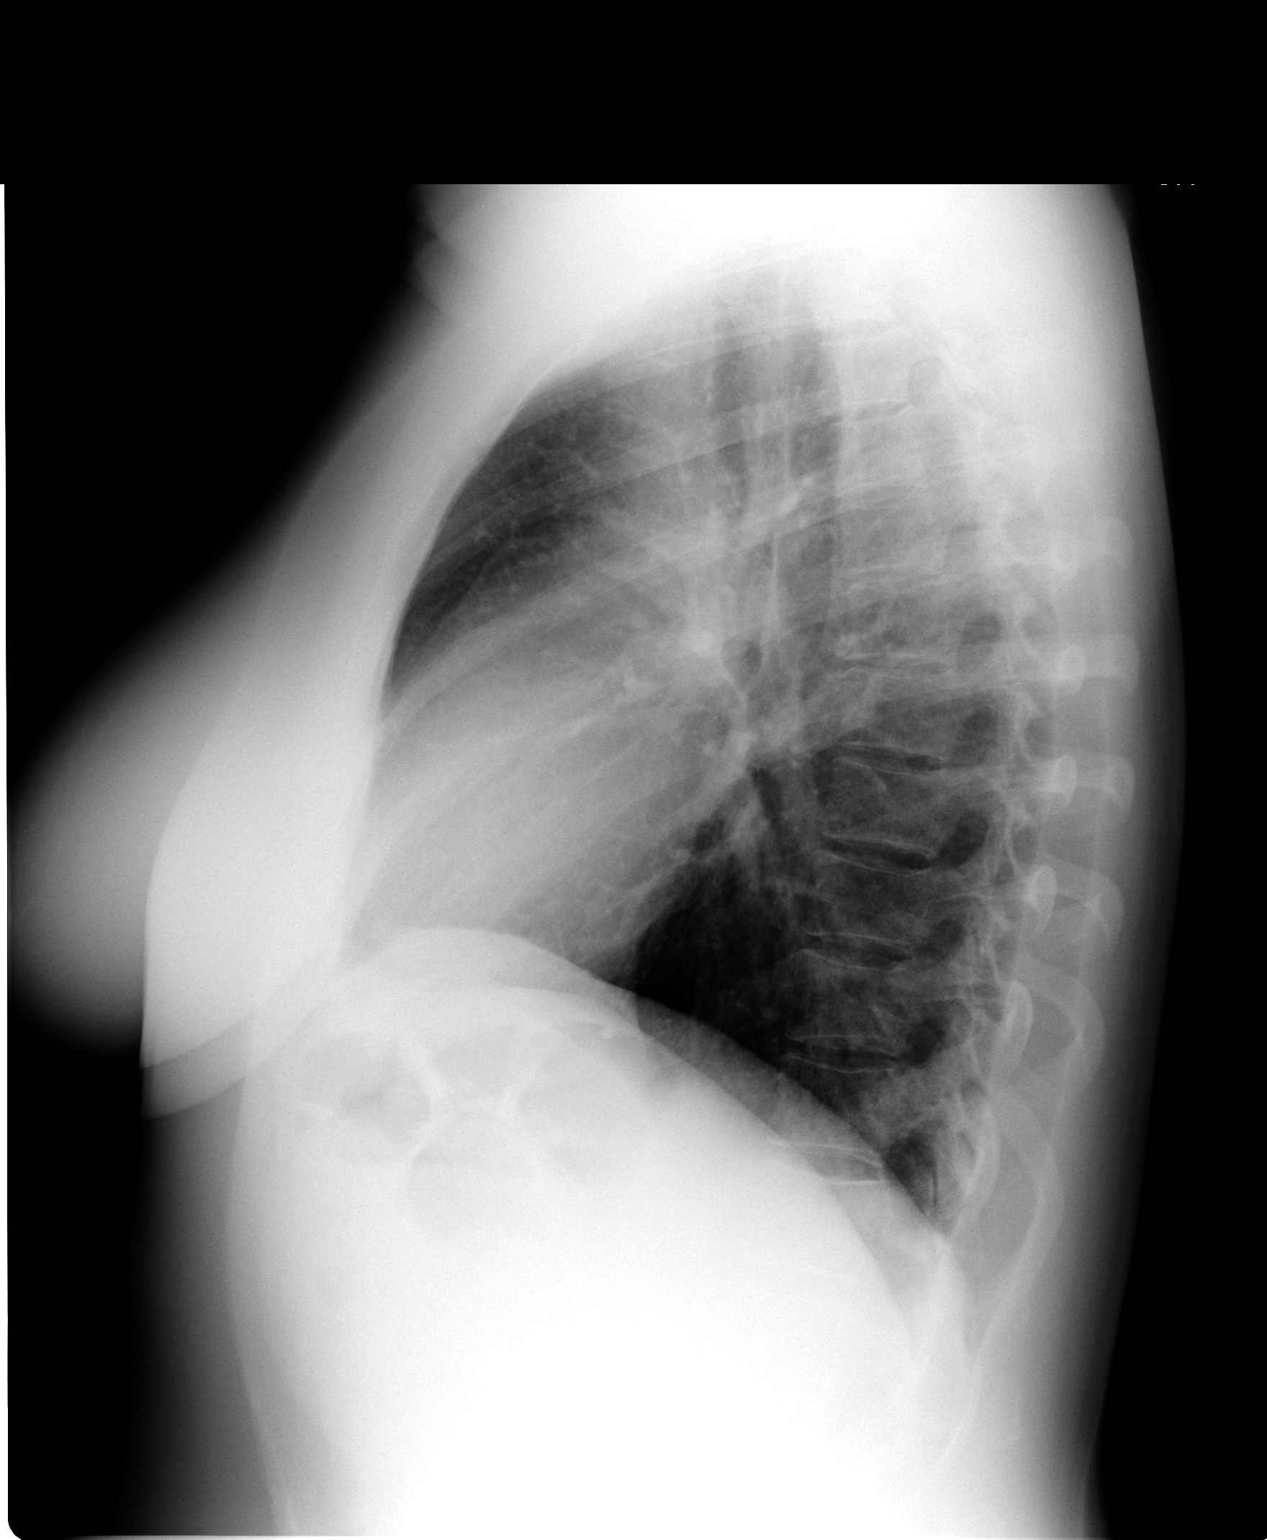

[2 of 2 positions shown; findings below may reference images not displayed]

FINDINGS: Normal heart size, mediastinal contours, and pulmonary vascularity.
Mild peribronchial thickening.
No pulmonary infiltrate, pleural effusion, or pneumothorax.
Bones unremarkable.
IMPRESSION: Minimal bronchitic changes without infiltrate or effusion.

## 2013-09-10 ENCOUNTER — Encounter: Payer: Self-pay | Admitting: Family Medicine

## 2013-09-10 ENCOUNTER — Ambulatory Visit (INDEPENDENT_AMBULATORY_CARE_PROVIDER_SITE_OTHER): Payer: BC Managed Care – PPO | Admitting: Family Medicine

## 2013-09-10 VITALS — BP 118/69 | HR 91 | Resp 16 | Wt 185.5 lb

## 2013-09-10 DIAGNOSIS — R200 Anesthesia of skin: Secondary | ICD-10-CM

## 2013-09-10 DIAGNOSIS — R5381 Other malaise: Secondary | ICD-10-CM

## 2013-09-10 DIAGNOSIS — R5383 Other fatigue: Principal | ICD-10-CM

## 2013-09-10 DIAGNOSIS — R209 Unspecified disturbances of skin sensation: Secondary | ICD-10-CM

## 2013-09-10 DIAGNOSIS — L659 Nonscarring hair loss, unspecified: Secondary | ICD-10-CM

## 2013-09-10 MED ORDER — ALBUTEROL SULFATE HFA 108 (90 BASE) MCG/ACT IN AERS: INHALATION_SPRAY | RESPIRATORY_TRACT | Status: AC

## 2013-09-10 MED ORDER — VARENICLINE TARTRATE 0.5 MG PO TABS: 0.5000 mg | ORAL_TABLET | Freq: Two times a day (BID) | ORAL | Status: DC

## 2013-09-10 NOTE — Progress Notes (Signed)
Subjective:    Patient ID: Tiffany Pearson, female    DOB: 1971/01/05, 43 y.o.   MRN: [id number]  HPI tierd all the time though works 3rd shift at work, hair falling out x severeal months, in the winter digits would go numb (would happen on the first and 2nd fingers on her right hand), and wt gain; patient is fasting today.  The 4th toes on each foot go numb.  No change in bowel movements.  No family hx of thyroid problems.  + fam hx of DM.  + hx of iron def anemia.  Still having her periods.  Last period started 3 days ago.  She sleeps "all the time".  Snores when really tied.  Does work 12 hour shifts.  No inc thrist or urination. No abnormal swollen LN or tissue   She is a Engineer, civil (consulting).    Review of Systems No CP or SOB.  She does still smoke   BP 118/69  Pulse 91  Resp 16  Wt 185 lb 8 oz (84.142 kg)  SpO2 98%    No Known Allergies  Past Medical History  Diagnosis Date  . Palpitations   . Bundle branch block     righ ehmiblock  . PMS (premenstrual syndrome)   . Leukoplakia     oral mucosa including tongue  . Allergic rhinitis     Past Surgical History  Procedure Laterality Date  . Skull surgery      History   Social History  . Marital Status: Married    Spouse Name: N/A    Number of Children: N/A  . Years of Education: N/A   Occupational History  . Not on file.   Social History Main Topics  . Smoking status: Former Smoker    Quit date: 10/20/2006  . Smokeless tobacco: Not on file     Comment: Quit smoking 5-08 after 10 pack years  . Alcohol Use: 0 - .5 oz/week    0-1 drink(s) per week     Comment: occ  . Drug Use: No  . Sexual Activity: Yes    Partners: Male   Other Topics Concern  . Not on file   Social History Narrative   3 cups caffeine daily. Does not exercise.     Family History  Problem Relation Age of Onset  . Diabetes Father   . Hypertension Father   . Transient ischemic attack Paternal Grandfather   . Lung cancer Cousin      Outpatient Encounter Prescriptions as of 09/10/2013  Medication Sig  . albuterol (PROAIR HFA) 108 (90 BASE) MCG/ACT inhaler INHALE 2 PUFFS INTO THE LUNGS 2 (TWO) TIMES DAILY AS NEEDED FOR WHEEZING.  Marland Kitchen varenicline (CHANTIX) 0.5 MG tablet Take 1 tablet (0.5 mg total) by mouth 2 (two) times daily.  . [DISCONTINUED] PROAIR HFA 108 (90 BASE) MCG/ACT inhaler INHALE 2 PUFFS INTO THE LUNGS 2 (TWO) TIMES DAILY AS NEEDED FOR WHEEZING.  . [DISCONTINUED] varenicline (CHANTIX) 0.5 MG tablet Take 1 tablet (0.5 mg total) by mouth 2 (two) times daily.          Objective:   Physical Exam  Constitutional: She is oriented to person, place, and time. She appears well-developed and well-nourished.  HENT:  Head: Normocephalic and atraumatic.  Right Ear: External ear normal.  Left Ear: External ear normal.  Nose: Nose normal.  Mouth/Throat: Oropharynx is clear and moist.  TMs and canals are clear.   Eyes: Conjunctivae and EOM are normal. Pupils are equal,  round, and reactive to light.  Neck: Neck supple. No thyromegaly present.  Cardiovascular: Normal rate, regular rhythm and normal heart sounds.   Pulmonary/Chest: Effort normal and breath sounds normal. She has no wheezes.  Abdominal: Soft. Bowel sounds are normal. She exhibits no distension and no mass. There is no tenderness. There is no rebound and no guarding.  Lymphadenopathy:    She has no cervical adenopathy.  Neurological: She is alert and oriented to person, place, and time.  Skin: Skin is warm and dry.  Psychiatric: She has a normal mood and affect.          Assessment & Plan:  Fatigue - will check for thyroid d/o and for anemia and for DM. Also consider maybe related to working third shift. May be shift work disorder. No increased thirst or urination but she does have a family history diabetes and would like to be tested. We'll perform a hemoglobin A1c. Also consider depression as a possible diagnosis on the differential.  Hair  loss-will check for B12 deficiency and iron deficiency. Her loss is generalized.

## 2013-09-11 LAB — COMPLETE METABOLIC PANEL WITH GFR
ALT: 12 U/L (ref 0–35)
AST: 16 U/L (ref 0–37)
Albumin: 4.5 g/dL (ref 3.5–5.2)
Alkaline Phosphatase: 58 U/L (ref 39–117)
BILIRUBIN TOTAL: 0.4 mg/dL (ref 0.2–1.2)
BUN: 13 mg/dL (ref 6–23)
CO2: 26 mEq/L (ref 19–32)
CREATININE: 0.77 mg/dL (ref 0.50–1.10)
Calcium: 9.2 mg/dL (ref 8.4–10.5)
Chloride: 105 mEq/L (ref 96–112)
GLUCOSE: 95 mg/dL (ref 70–99)
Potassium: 4.2 mEq/L (ref 3.5–5.3)
Sodium: 141 mEq/L (ref 135–145)
Total Protein: 6.6 g/dL (ref 6.0–8.3)

## 2013-09-11 LAB — VITAMIN D 25 HYDROXY (VIT D DEFICIENCY, FRACTURES): VIT D 25 HYDROXY: 23 ng/mL — AB (ref 30–89)

## 2013-09-11 LAB — FOLATE: FOLATE: 8.7 ng/mL

## 2013-09-11 LAB — ANA: Anti Nuclear Antibody(ANA): NEGATIVE

## 2013-09-11 LAB — SEDIMENTATION RATE: Sed Rate: 1 mm/hr (ref 0–22)

## 2013-09-11 LAB — C-REACTIVE PROTEIN: CRP: <0.5 mg/dL (ref <0.60)

## 2013-09-11 LAB — FERRITIN: Ferritin: 28 ng/mL (ref 10–291)

## 2013-09-11 LAB — VITAMIN B12: VITAMIN B 12: 243 pg/mL (ref 211–911)

## 2013-09-11 LAB — T4, FREE: Free T4: 0.74 ng/dL — ABNORMAL LOW (ref 0.80–1.80)

## 2013-09-11 LAB — T3, FREE: T3, Free: 2.6 pg/mL (ref 2.3–4.2)

## 2013-09-11 LAB — TSH: TSH: 1.491 u[IU]/mL (ref 0.350–4.500)

## 2013-10-08 ENCOUNTER — Encounter: Payer: Self-pay | Admitting: Family Medicine

## 2013-10-08 ENCOUNTER — Ambulatory Visit (INDEPENDENT_AMBULATORY_CARE_PROVIDER_SITE_OTHER): Payer: BC Managed Care – PPO | Admitting: Family Medicine

## 2013-10-08 VITALS — BP 132/83 | HR 95 | Ht 65.0 in | Wt 187.0 lb

## 2013-10-08 DIAGNOSIS — F32A Depression, unspecified: Secondary | ICD-10-CM

## 2013-10-08 DIAGNOSIS — R946 Abnormal results of thyroid function studies: Secondary | ICD-10-CM

## 2013-10-08 DIAGNOSIS — E559 Vitamin D deficiency, unspecified: Secondary | ICD-10-CM

## 2013-10-08 DIAGNOSIS — R5383 Other fatigue: Principal | ICD-10-CM

## 2013-10-08 DIAGNOSIS — D509 Iron deficiency anemia, unspecified: Secondary | ICD-10-CM

## 2013-10-08 DIAGNOSIS — E538 Deficiency of other specified B group vitamins: Secondary | ICD-10-CM

## 2013-10-08 DIAGNOSIS — F329 Major depressive disorder, single episode, unspecified: Secondary | ICD-10-CM

## 2013-10-08 DIAGNOSIS — R7989 Other specified abnormal findings of blood chemistry: Secondary | ICD-10-CM

## 2013-10-08 DIAGNOSIS — R5381 Other malaise: Secondary | ICD-10-CM

## 2013-10-08 NOTE — Progress Notes (Signed)
Subjective:    Patient ID: Tiffany Pearson, female    DOB: [birth year], 43 y.o.   MRN: [id number]  HPI  Here for followup fatigue. I saw her approximately 4 weeks ago. She was complaining of severe fatigue. She was working 12 hour shifts and was not sleeping well. We did do extensive blood work at that time. wil do depression screen today as well. She's here is to go over lab work and also to do a screening evaluation for depression. She says sometimes she does feel little bit down but is not really sure why. She just doesn't feel happy at other times. She denies any major stressors or issues at work or home that are contributing to this. No recent major changes in life such as a mood or loss of the left 1 et cetera. She has noticed a little bit of tingling in her first finger on her right hand since I last saw her but overall the numbness and tingling has been significantly better. She says in fact the finger a most turned white from the PIP to the DIP area. She also felt the same sensation in 2 of her toes the same time but did not take her shoes off to see if it turned white as well.  Since getting a call about her lab results she has started a multivitamin over-the-counter with extra vitamin D. She's also started some sublingual B12 supplement. Review of Systems     Objective:   Physical Exam  Constitutional: She is oriented to person, place, and time. She appears well-developed and well-nourished.  HENT:  Head: Normocephalic and atraumatic.  Cardiovascular: Normal rate, regular rhythm and normal heart sounds.   Pulmonary/Chest: Effort normal and breath sounds normal.  Neurological: She is alert and oriented to person, place, and time.  Skin: Skin is warm and dry.  Psychiatric: She has a normal mood and affect. Her behavior is normal.          Assessment & Plan:  Fatigue - her to go over bloodwork.  PHQ- 9 score of 7 today. Discussed that this does indicate some mild depressive  symptoms. She does report a little interest or pleasure in doing things several days a week. This could certainly be contributing to her fatigue in addition to the low levels of iron, B12 and vitamin D. I encouraged her to think about treatment for this. [place] counseling or therapy or possibly medication. She denies any external stressors or factors that might be contributing to decreased mood.  Vitamin B12 deficiency-we'll repeat and check after her 8 week course.  Vitamin D deficiency-will replete and check after 8 week course.  Iron deficiency anemia-will replete and recheck after 8 week course. Lab slip provided.  Recommend change multivitamin to a PNV with extra iron. Also encourage iron rich diet. Warned that sometimes extra iron can be constipating. Note she did have a history of iron deficiency anemia during one of her pregnancies.  Take vitamin D.  Add b12 as well, sublingual.  Repeat lab work in 2-4 weeks for a total of 6-8 weeks on therapy.  Paresthesia-I. would like to see if the numbness in the fingers and toes that occurs intermittently improves as she replaced her B12. If it does not then will evaluate further. Also consider with the skin changing color that this could be a Raynaud's phenomenon. It happened much more frequently in the winter has actually been better since the weather has gotten warmer. This is definitely a  diagnosis on the differential.   Subclinical hypothyroid-TSH was normal but T4 was a little bit low. I do not think that this is causing any significant symptoms for her. I did explain to her that the incidence of becoming completely hypothyroid is quite significant and that we should follow this periodically to make sure that she is not becoming hypothyroid. Explained that sometimes changes can be transient with acute illnesses, viruses, et Karie Sodacetera.

## 2013-10-08 NOTE — Patient Instructions (Signed)
Go for labs in 2-4 weeks.

## 2013-10-26 ENCOUNTER — Other Ambulatory Visit: Payer: Self-pay | Admitting: *Deleted

## 2013-10-26 DIAGNOSIS — E039 Hypothyroidism, unspecified: Secondary | ICD-10-CM

## 2013-10-26 DIAGNOSIS — E611 Iron deficiency: Secondary | ICD-10-CM

## 2013-10-26 LAB — VITAMIN B12: VITAMIN B 12: 610 pg/mL (ref 211–911)

## 2013-10-26 LAB — VITAMIN D 25 HYDROXY (VIT D DEFICIENCY, FRACTURES): Vit D, 25-Hydroxy: 40 ng/mL (ref 30–89)

## 2013-10-26 LAB — T3, FREE: T3 FREE: 2.9 pg/mL (ref 2.3–4.2)

## 2013-10-26 LAB — T4, FREE: FREE T4: 0.79 ng/dL — AB (ref 0.80–1.80)

## 2013-10-26 LAB — TSH: TSH: 2.234 u[IU]/mL (ref 0.350–4.500)

## 2013-10-26 LAB — FERRITIN: Ferritin: 35 ng/mL (ref 10–291)

## 2014-09-12 ENCOUNTER — Encounter: Payer: Self-pay | Admitting: Family Medicine

## 2014-11-05 ENCOUNTER — Ambulatory Visit (INDEPENDENT_AMBULATORY_CARE_PROVIDER_SITE_OTHER): Payer: BLUE CROSS/BLUE SHIELD | Admitting: Family Medicine

## 2014-11-05 ENCOUNTER — Other Ambulatory Visit (HOSPITAL_COMMUNITY)
Admission: RE | Admit: 2014-11-05 | Discharge: 2014-11-05 | Disposition: A | Discharge status: Home / Self Care | Payer: BLUE CROSS/BLUE SHIELD | Source: Ambulatory Visit | Attending: Family Medicine | Admitting: Family Medicine

## 2014-11-05 ENCOUNTER — Encounter: Payer: Self-pay | Admitting: Family Medicine

## 2014-11-05 VITALS — BP 130/83 | HR 98 | Wt 196.0 lb

## 2014-11-05 DIAGNOSIS — Z01419 Encounter for gynecological examination (general) (routine) without abnormal findings: Secondary | ICD-10-CM | POA: Insufficient documentation

## 2014-11-05 DIAGNOSIS — Z1231 Encounter for screening mammogram for malignant neoplasm of breast: Secondary | ICD-10-CM | POA: Diagnosis not present

## 2014-11-05 DIAGNOSIS — E669 Obesity, unspecified: Secondary | ICD-10-CM

## 2014-11-05 DIAGNOSIS — Z1151 Encounter for screening for human papillomavirus (HPV): Secondary | ICD-10-CM | POA: Insufficient documentation

## 2014-11-05 DIAGNOSIS — R635 Abnormal weight gain: Secondary | ICD-10-CM

## 2014-11-05 DIAGNOSIS — Z720 Tobacco use: Secondary | ICD-10-CM | POA: Diagnosis not present

## 2014-11-05 MED ORDER — PHENTERMINE HCL 37.5 MG PO CAPS: 37.5000 mg | ORAL_CAPSULE | ORAL | Status: DC

## 2014-11-05 NOTE — Addendum Note (Signed)
Addended by: Nani GasserMETHENEY, Kensy Blizard D on: 11/05/2014 03:54 PM   Modules accepted: Orders, Level of Service

## 2014-11-05 NOTE — Patient Instructions (Signed)
Keep up a regular exercise program and make sure you are eating a healthy diet Try to eat 4 servings of dairy a day, or if you are lactose intolerant take a calcium with vitamin D daily.  Your vaccines are up to date.   

## 2014-11-05 NOTE — Progress Notes (Signed)
Subjective:     Tiffany Pearson is a 44 y.o. female and is here for a comprehensive physical exam. The patient reports no problems. Due for pap and mammo.  Last period was May 8th. Still having some spotting.  She has gained about 9 lbs since hre a month ago   History   Social History  . Marital Status: Married    Spouse Name: N/A  . Number of Children: N/A  . Years of Education: N/A   Occupational History  . Not on file.   Social History Main Topics  . Smoking status: Former Smoker    Quit date: 10/20/2006  . Smokeless tobacco: Not on file     Comment: Quit smoking 5-08 after 10 pack years  . Alcohol Use: 0.0 - 0.5 oz/week    0-1 drink(s) per week     Comment: occ  . Drug Use: No  . Sexual Activity:    Partners: Male   Other Topics Concern  . Not on file   Social History Narrative   3 cups caffeine daily. Does not exercise.    Health Maintenance  Topic Date Due  . HIV Screening  12/13/1984  . PAP SMEAR  07/29/2014  . MAMMOGRAM  09/04/2014  . INFLUENZA VACCINE  01/20/2015  . TETANUS/TDAP  10/21/2019    The following portions of the patient's history were reviewed and updated as appropriate: allergies, current medications, past family history, past medical history, past social history, past surgical history and problem list.  Review of Systems A comprehensive review of systems was negative.   Objective:    BP 130/83 mmHg  Pulse 98  Wt 196 lb (88.905 kg)  SpO2 100% General appearance: alert, cooperative and appears stated age Head: Normocephalic, without obvious abnormality, atraumatic Eyes: conj clear, EOMI, PEERLA Ears: normal TM's and external ear canals both ears Nose: Nares normal. Septum midline. Mucosa normal. No drainage or sinus tenderness. Throat: lips, mucosa, and tongue normal; teeth and gums normal Neck: no adenopathy, no carotid bruit, no JVD, supple, symmetrical, trachea midline and thyroid not enlarged, symmetric, no  tenderness/mass/nodules Back: symmetric, no curvature. ROM normal. No CVA tenderness. Lungs: clear to auscultation bilaterally Breasts: normal appearance, no masses or tenderness Heart: regular rate and rhythm, S1, S2 normal, no murmur, click, rub or gallop Abdomen: soft, non-tender; bowel sounds normal; no masses,  no organomegaly Pelvic: cervix normal in appearance, external genitalia normal, no adnexal masses or tenderness, no cervical motion tenderness, rectovaginal septum normal, uterus normal size, shape, and consistency and vagina normal without discharge Extremities: extremities normal, atraumatic, no cyanosis or edema Pulses: 2+ and symmetric Skin: Skin color, texture, turgor normal. No rashes or lesions Lymph nodes: Cervical, supraclavicular, and axillary nodes normal. Neurologic: Alert and oriented X 3, normal strength and tone. Normal symmetric reflexes. Normal coordination and gait    Assessment:    Healthy female exam.      Plan:     See After Visit Summary for Counseling Recommendations   Keep up a regular exercise program and make sure you are eating a healthy diet Try to eat 4 servings of dairy a day, or if you are lactose intolerant take a calcium with vitamin D daily.  Your vaccines are up to date.  Will call with pap smear results.    Tob abuse - Discussed cessation. Chantix vs nicotine replacement. She is actually tried Chantix before and did well with it. But she has never tried the nicotine replacement in 1 to discuss  it today. Discussed how to use it and given handout as well as a coupon card. If she changes her mind and would like to go back to the Chantix and that is fine.  Abnormal weight gain -  Need to lose about 50 lbs to get to normal BMI. We discussed options. Discussed the importance of setting calorie goals. Recommend the smart phone application called my fitness PAL. Also discussed strategies for scheduling exercise. Also discussed weight loss  medications. She would like to start with phentermine. One about the potential side effects. She will need follow-up monthly for medical management. She's to stop immediately if she experiences any chest pain or palpitations.

## 2014-11-07 LAB — CYTOLOGY - PAP

## 2014-11-08 NOTE — Progress Notes (Signed)
Quick Note:  Call patient: Your Pap smear is normal. Repeat in 5 years. ______ 

## 2014-11-15 LAB — CBC
HEMATOCRIT: 41.9 % (ref 36.0–46.0)
HEMOGLOBIN: 14.2 g/dL (ref 12.0–15.0)
MCH: 31.9 pg (ref 26.0–34.0)
MCHC: 33.9 g/dL (ref 30.0–36.0)
MCV: 94.2 fL (ref 78.0–100.0)
MPV: 11.3 fL (ref 8.6–12.4)
Platelets: 324 10*3/uL (ref 150–400)
RBC: 4.45 MIL/uL (ref 3.87–5.11)
RDW: 13.2 % (ref 11.5–15.5)
WBC: 8.4 10*3/uL (ref 4.0–10.5)

## 2014-11-15 LAB — COMPLETE METABOLIC PANEL WITH GFR
ALBUMIN: 4.7 g/dL (ref 3.5–5.2)
ALK PHOS: 73 U/L (ref 39–117)
ALT: 14 U/L (ref 0–35)
AST: 18 U/L (ref 0–37)
BILIRUBIN TOTAL: 0.4 mg/dL (ref 0.2–1.2)
BUN: 15 mg/dL (ref 6–23)
CO2: 27 meq/L (ref 19–32)
CREATININE: 0.82 mg/dL (ref 0.50–1.10)
Calcium: 9.8 mg/dL (ref 8.4–10.5)
Chloride: 102 mEq/L (ref 96–112)
GFR, EST NON AFRICAN AMERICAN: 87 mL/min
Glucose, Bld: 98 mg/dL (ref 70–99)
Potassium: 4.2 mEq/L (ref 3.5–5.3)
Sodium: 139 mEq/L (ref 135–145)
Total Protein: 7.7 g/dL (ref 6.0–8.3)

## 2014-11-15 LAB — LIPID PANEL
CHOLESTEROL: 226 mg/dL — AB (ref 0–200)
HDL: 62 mg/dL (ref >=46)
LDL Cholesterol: 141 mg/dL — ABNORMAL HIGH (ref 0–99)
TRIGLYCERIDES: 116 mg/dL (ref <150)
Total CHOL/HDL Ratio: 3.6 Ratio
VLDL: 23 mg/dL (ref 0–40)

## 2014-12-03 ENCOUNTER — Ambulatory Visit (INDEPENDENT_AMBULATORY_CARE_PROVIDER_SITE_OTHER): Payer: BLUE CROSS/BLUE SHIELD | Admitting: Family Medicine

## 2014-12-03 VITALS — BP 120/80 | HR 92 | Wt 193.0 lb

## 2014-12-03 DIAGNOSIS — R635 Abnormal weight gain: Secondary | ICD-10-CM

## 2014-12-03 DIAGNOSIS — Z6832 Body mass index (BMI) 32.0-32.9, adult: Secondary | ICD-10-CM

## 2014-12-03 MED ORDER — PHENTERMINE HCL 37.5 MG PO CAPS: 37.5000 mg | ORAL_CAPSULE | ORAL | Status: DC

## 2014-12-03 NOTE — Progress Notes (Signed)
   Subjective:    Patient ID: Tiffany Pearson, female    DOB: 09-13-1970, 43 y.o.   MRN: [id number]  HPI    Review of Systems     Objective:   Physical Exam        Assessment & Plan:  Abnormal weight gain/BMI 32 - Ok to refill phentermine. She is not going to be successful without some lifestyle changes.    Nani Gasser, MD

## 2014-12-03 NOTE — Progress Notes (Signed)
   Subjective:    Patient ID: Tiffany Pearson, female    DOB: 04-27-1971, 44 y.o.   MRN: [id number]  HPI  Patient is here for blood pressure and weight check. Denies any trouble sleeping, palpitations, or any other medication problems. Questioned if Pt was still taking the Rx, Chantix. Pt states she is not and has begun smoking again. Inquired about her diet/exercise, states she is only taking the Phentermine but has not made any other changes.  Review of Systems     Objective:   Physical Exam        Assessment & Plan:  Patient has lost weight. A refill for Phentermine will be sent to patient preferred pharmacy. Patient advised to schedule a four week nurse visit and keep her upcoming appointment with her PCP. Verbalized understanding, no further questions.

## 2015-01-02 ENCOUNTER — Ambulatory Visit: Payer: BLUE CROSS/BLUE SHIELD

## 2015-01-06 ENCOUNTER — Ambulatory Visit (INDEPENDENT_AMBULATORY_CARE_PROVIDER_SITE_OTHER): Payer: BLUE CROSS/BLUE SHIELD | Admitting: Family Medicine

## 2015-01-06 VITALS — BP 127/85 | HR 95 | Wt 202.0 lb

## 2015-01-06 DIAGNOSIS — R635 Abnormal weight gain: Secondary | ICD-10-CM | POA: Diagnosis not present

## 2015-01-06 NOTE — Progress Notes (Signed)
   Subjective:    Patient ID: Tiffany Pearson, female    DOB: [birth year], 44 y.o.   MRN: [id number]  HPI Patient is here for blood pressure and weight check. Denies any trouble sleeping, palpitations, or any other medication problems. Inquired if Pt has been taking her Phentermine Rx as prescribed, Pt states she "forgets to take it" and "she has only taken maybe 2 pills since she got the Rx. Pt states she is going to start taking the Rx today as prescribed.   Review of Systems     Objective:   Physical Exam        Assessment & Plan:  Patient has not lost weight. A refill for Phentermine will be sent to provider for review. Patient advised we will contact her if she needs to schedule a four week nurse visit and reminded to keep her upcoming appointment with her PCP. Verbalized understanding, no further questions.

## 2015-01-06 NOTE — Progress Notes (Signed)
   Subjective:    Patient ID: Tiffany Pearson, female    DOB: 1971/04/21, 44 y.o.   MRN: [id number]  HPI Agree with below.  Can start med and f/u in 1 months.  Tiffany Gasseratherine Jennavie Martinek, MD   Review of Systems     Objective:   Physical Exam        Assessment & Plan:

## 2015-02-26 ENCOUNTER — Emergency Department (INDEPENDENT_AMBULATORY_CARE_PROVIDER_SITE_OTHER)
Admission: EM | Admit: 2015-02-26 | Discharge: 2015-02-26 | Disposition: A | Discharge status: Home / Self Care | Payer: BLUE CROSS/BLUE SHIELD | Source: Home / Self Care | Attending: Family Medicine | Admitting: Family Medicine

## 2015-02-26 ENCOUNTER — Emergency Department (INDEPENDENT_AMBULATORY_CARE_PROVIDER_SITE_OTHER): Payer: BLUE CROSS/BLUE SHIELD

## 2015-02-26 ENCOUNTER — Encounter: Payer: Self-pay | Admitting: *Deleted

## 2015-02-26 DIAGNOSIS — S63642A Sprain of metacarpophalangeal joint of left thumb, initial encounter: Secondary | ICD-10-CM

## 2015-02-26 DIAGNOSIS — M79645 Pain in left finger(s): Secondary | ICD-10-CM | POA: Diagnosis not present

## 2015-02-26 NOTE — Discharge Instructions (Signed)
May continue ice pack for 15 to 20 minutes, 3 to 4 times daily  Continue until pain decreases.  May take Ibuprofen , 4 tabs every 8 hours with food.  Wear splint for about two weeks.  Begin range of motion and stretching exercises when tolerated.   Gamekeeper's, Skier's Thumb You have injured some ligaments in your thumb. This can happen suddenly or over a long period of time. It may be difficult for you to hold things by pinching them between your thumb and finger. The injury may take 6 to 8 weeks to heal. Your injury is a strain injury and not a complete tear so it may be treated with a cast. A complete tear of the ligament can lead to a loss of function of the thumb if not fixed surgically. It got its name from when gamekeepers used to kill game (hunted or trapped animals) by pinning them down around the neck between their thumb and index finger.  HOME CARE INSTRUCTIONS   Apply ice to the injury for 15-20 minutes, 03-04 times per day for the first 2 days. Put the ice in a plastic bag and place a towel between the bag of ice and your skin.  Avoid using your thumb for as long as directed by your caregiver if it is not splinted or in a cast.  If your hand has been casted or splinted while your thumb heals, follow these instructions:  Plaster or fiberglass cast:  Do not try to scratch the skin under the cast using a sharp or pointed object.  Check the skin around the cast every day. You may put lotion on any red or sore areas.  Keep your cast dry. Your cast can be protected during bathing with a plastic bag. Do not put your cast into the water.  If your fiberglass cast gets wet, it can be gently dried using a hair dryer. Be careful not burn yourself.  Plaster splint:  Wear the splint for as long as directed by your caregiver or until you are seen for a follow-up examination.  Do not get your splint wet. Protect it during bathing with a plastic bag.  You may loosen the elastic bandage  around the splint if your fingers start to get numb, tingle, get cold, or turn blue.  Do not put pressure on your cast or splint; this may cause it to break. Do not lean on hard surfaces for 24 hours after application.  Only take over-the-counter or prescription medicines for pain, discomfort, or fever as directed by your caregiver.  IMPORTANT: follow up with your caregiver or keep or call for any appointments with specialists as directed. The failure to follow up could result in chronic pain and / or disability. SEEK IMMEDIATE MEDICAL CARE IF:  Your thumb or fingers change color, become painful, or there is numbness or tingling in your thumb or fingers. Your cast or splint may be too tight. MAKE SURE YOU:   Understand these instructions.  Will watch your condition.  Will get help right away if you are not doing well or get worse. Document Released: 06/07/2005 Document Revised: 08/30/2011 Document Reviewed: 01/25/2008 [facility] Patient Information 2015 [place], [place]. This information is not intended to replace advice given to you by your health care provider. Make sure you discuss any questions you have with your health care provider.

## 2015-02-26 NOTE — ED Notes (Signed)
Pt c/o 5 days of left thumb pain without injury. She came in from walking her dog, washed her hands and felt pain on her left thumb. Swelling and discomfort present.

## 2015-02-26 NOTE — ED Provider Notes (Signed)
CSN: [id number]     Arrival date & time 02/26/15  1238 History   First MD Initiated Contact with Patient 02/26/15 1330     Chief Complaint  Patient presents with  . Hand Pain    left thumb     HPI Comments: Patient complains of six day history of pain in her left thumb.  She recalls no injury, but noticed pain in her thumb after walking her dog.  Patient is a 44 y.o. female presenting with hand pain. The history is provided by the patient.  Hand Pain This is a new problem. Episode onset: 6 days ago. The problem occurs constantly. The problem has not changed since onset.Exacerbated by: flexing left thumb. Nothing relieves the symptoms. Treatments tried: ice. The treatment provided no relief.    Past Medical History  Diagnosis Date  . Palpitations   . Bundle branch block     righ ehmiblock  . PMS (premenstrual syndrome)   . Leukoplakia     oral mucosa including tongue  . Allergic rhinitis    Past Surgical History  Procedure Laterality Date  . Skull surgery     Family History  Problem Relation Age of Onset  . Diabetes Father   . Hypertension Father   . Transient ischemic attack Paternal Grandfather   . Lung cancer Cousin    Social History  Substance Use Topics  . Smoking status: Current Every Day Smoker -- 0.50 packs/day    Types: Cigarettes    Last Attempt to Quit: 10/20/2006  . Smokeless tobacco: None     Comment: Quit smoking 5-08 after 10 pack years. 12/03/14 advised she had started smoking again.  . Alcohol Use: 0.0 - 0.6 oz/week    0-1 Standard drinks or equivalent per week     Comment: occ   OB History    No data available     Review of Systems  All other systems reviewed and are negative.   Allergies  Review of patient's allergies indicates no known allergies.  Home Medications   Prior to Admission medications   Medication Sig Start Date End Date Taking? Authorizing Provider  albuterol (PROAIR HFA) 108 (90 BASE) MCG/ACT inhaler INHALE 2 PUFFS INTO THE  LUNGS 2 (TWO) TIMES DAILY AS NEEDED FOR WHEEZING. 09/10/13   Agapito Games, MD  phentermine 37.5 MG capsule Take 1 capsule (37.5 mg total) by mouth every morning. Patient not taking: Reported on 01/06/2015 12/03/14   Agapito Games, MD   Meds Ordered and Administered this Visit  Medications - No data to display  BP 132/86 mmHg  Pulse 102  Resp 14  Ht 5\' 5"  (1.651 m)  Wt 192 lb (87.091 kg)  BMI 31.95 kg/m2  SpO2 99%  LMP 02/19/2015 No data found.   Physical Exam  Constitutional: She is oriented to person, place, and time. She appears well-developed and well-nourished. No distress.  HENT:  Head: Normocephalic.  Eyes: Pupils are equal, round, and reactive to light.  Musculoskeletal:       Left hand: She exhibits tenderness and bony tenderness. She exhibits normal range of motion, normal two-point discrimination, normal capillary refill, no deformity, no laceration and no swelling.       Hands: Left thumb has distinct tenderness to palpation over the ulnar collateral ligament.  No swelling of MCP joint, and joint appears stable.  Neurological: She is alert and oriented to person, place, and time.  Skin: Skin is warm.  Nursing note and vitals reviewed.  ED Course  Procedures  None   Imaging Review Dg Finger Thumb Left  02/26/2015   CLINICAL DATA:  Thumb pain and swelling  EXAM: LEFT THUMB 2+V  COMPARISON:  None.  FINDINGS: No acute fracture.  No dislocation.  IMPRESSION: No acute bony pathology.   Electronically Signed   By: Jolaine Click M.D.   On: 02/26/2015 13:26     MDM   1. Skier's thumb, left, initial encounter    Thumb spica splint applied. May continue ice pack for 15 to 20 minutes, 3 to 4 times daily  Continue until pain decreases.  May take Ibuprofen , 4 tabs every 8 hours with food.  Wear splint for about two weeks.  Begin range of motion and stretching exercises when tolerated. Followup with Dr. Rodney Langton or Dr. Clementeen Graham ([facility]) if not improving about two weeks.     Lattie Haw, MD 02/27/15 1116

## 2015-03-02 ENCOUNTER — Telehealth: Payer: Self-pay

## 2015-07-23 ENCOUNTER — Ambulatory Visit (INDEPENDENT_AMBULATORY_CARE_PROVIDER_SITE_OTHER): Payer: BLUE CROSS/BLUE SHIELD | Admitting: Family Medicine

## 2015-07-23 ENCOUNTER — Ambulatory Visit (INDEPENDENT_AMBULATORY_CARE_PROVIDER_SITE_OTHER): Payer: BLUE CROSS/BLUE SHIELD

## 2015-07-23 ENCOUNTER — Encounter: Payer: Self-pay | Admitting: Family Medicine

## 2015-07-23 VITALS — BP 127/77 | HR 80 | Wt 200.0 lb

## 2015-07-23 DIAGNOSIS — M25511 Pain in right shoulder: Secondary | ICD-10-CM | POA: Diagnosis not present

## 2015-07-23 MED ORDER — DICLOFENAC SODIUM 2 % TD SOLN: 2.0000 | Freq: Two times a day (BID) | TRANSDERMAL | Status: AC

## 2015-07-23 NOTE — Patient Instructions (Signed)
Thank you for coming in today. Continue ibuprofen.  Use Pennsaid.  Get xray  Return in 2-3 weeks.  If not better we will get a MRI.

## 2015-07-23 NOTE — Progress Notes (Signed)
Quick Note:  Xray is normal. ______ 

## 2015-07-23 NOTE — Progress Notes (Signed)
   Subjective:    I'm seeing this patient as a consultation for:  Dr. Linford Arnold  CC: [place] joint pain  HPI: Patient has a two-week history of pain in her right [place] joint. She notes the pain after she was pulled by her heavy dog. She denies any falls or specific injury. She notes pain is worse with arm motion and at night. Pain is located in the [facility] joint on the right. She does note some mild pain in her lateral upper arm that's worse with activity. She has tried ibuprofen which helps some. She works as a Engineer, civil (consulting) at [employer].  Past medical history, Surgical history, Family history not pertinant except as noted below, Social history, Allergies, and medications have been entered into the medical record, reviewed, and no changes needed.   Review of Systems: No headache, visual changes, nausea, vomiting, diarrhea, constipation, dizziness, abdominal pain, skin rash, fevers, chills, night sweats, weight loss, swollen lymph nodes, body aches, joint swelling, muscle aches, chest pain, shortness of breath, mood changes, visual or auditory hallucinations.   Objective:    Filed Vitals:   07/23/15 1416  BP: 127/77  Pulse: 80   General: Well Developed, well nourished, and in no acute distress.  Neuro/Psych: Alert and oriented x3, extra-ocular muscles intact, able to move all 4 extremities, sensation grossly intact. Skin: Warm and dry, no rashes noted.  Respiratory: Not using accessory muscles, speaking in full sentences, trachea midline.  Cardiovascular: Pulses palpable, no extremity edema. Abdomen: Does not appear distended. MSK: Right shoulder is normal appearing. Right [place] joint is mildly swollen. Shoulder is nontender. Right [place] joint is mildly tender Normal shoulder motion with pain with abduction from 140-170 Internal motion is limited to the lumbar spine compared to the thoracic spine on the left. Normal external motion. Mildly positive Hawkin's test. Negative Neer's test. Negative O'Brien's  test. Negative empty can test. Negative Yergason's and speeds test. Strength is intact throughout. Upper extremity pulses and sensation are intact.  X-ray clavicle right pending.  No results found for this or any previous visit (from the past 24 hour(s)). No results found.  Impression and Recommendations:   This case required medical decision making of moderate complexity.

## 2015-07-23 NOTE — Assessment & Plan Note (Signed)
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 X-ray pending. Trial of topical Pennsaid and oral ibuprofen. Relative rest. Recheck in a few weeks if still symptomatic at that time will consider MRI.

## 2015-08-14 ENCOUNTER — Ambulatory Visit: Payer: BLUE CROSS/BLUE SHIELD | Admitting: Family Medicine
